# Patient Record
Sex: Female | Born: 1990 | Race: White | Hispanic: No | Marital: Single | Smoking: Never smoker
Health system: Southern US, Community
[De-identification: ages and names within clinical notes are randomized; demographics above are authoritative.]

## PROBLEM LIST (undated history)

## (undated) DIAGNOSIS — F988 Other specified behavioral and emotional disorders with onset usually occurring in childhood and adolescence: Secondary | ICD-10-CM

## (undated) DIAGNOSIS — F419 Anxiety disorder, unspecified: Secondary | ICD-10-CM

## (undated) DIAGNOSIS — R630 Anorexia: Secondary | ICD-10-CM

## (undated) DIAGNOSIS — F32A Depression, unspecified: Secondary | ICD-10-CM

## (undated) HISTORY — PX: WISDOM TOOTH EXTRACTION: SHX21

## (undated) HISTORY — DX: Depression, unspecified: F32.A

## (undated) HISTORY — PX: TONSILLECTOMY: SUR1361

## (undated) HISTORY — PX: ADENOIDECTOMY: SUR15

## (undated) HISTORY — DX: Anxiety disorder, unspecified: F41.9

---
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
## 2011-07-09 ENCOUNTER — Encounter: Payer: Self-pay | Admitting: *Deleted

## 2011-07-09 ENCOUNTER — Emergency Department (HOSPITAL_BASED_OUTPATIENT_CLINIC_OR_DEPARTMENT_OTHER)
Admission: EM | Admit: 2011-07-09 | Discharge: 2011-07-09 | Disposition: A | Discharge status: Home / Self Care | Payer: BC Managed Care – PPO | Attending: Emergency Medicine | Admitting: Emergency Medicine

## 2011-07-09 DIAGNOSIS — X19XXXA Contact with other heat and hot substances, initial encounter: Secondary | ICD-10-CM | POA: Insufficient documentation

## 2011-07-09 DIAGNOSIS — T2000XA Burn of unspecified degree of head, face, and neck, unspecified site, initial encounter: Secondary | ICD-10-CM

## 2011-07-09 DIAGNOSIS — T2012XA Burn of first degree of lip(s), initial encounter: Secondary | ICD-10-CM | POA: Insufficient documentation

## 2011-07-09 MED ORDER — SILVER SULFADIAZINE 1 % EX CREA
TOPICAL_CREAM | Freq: Once | CUTANEOUS | Status: AC
  Administered 2011-07-09: 1 via TOPICAL

## 2011-07-09 MED ORDER — SILVER SULFADIAZINE 1 % EX CREA
TOPICAL_CREAM | CUTANEOUS | Status: AC
  Filled 2011-07-09: qty 85

## 2011-07-09 NOTE — ED Notes (Signed)
Wound care reviewed.

## 2011-07-09 NOTE — ED Provider Notes (Signed)
Medical screening examination/treatment/procedure(s) were performed by non-physician practitioner and as supervising physician I was immediately available for consultation/collaboration.   Jasmine Awe, MD 07/09/11 2250
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
## 2011-07-09 NOTE — ED Notes (Signed)
Wound care reviewed with pt and parent
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
## 2011-07-09 NOTE — ED Notes (Addendum)
Pt went to get areas of her face waxed earlier today. Now presents with deep reddened area over right side of mouth.

## 2013-03-19 ENCOUNTER — Emergency Department (HOSPITAL_BASED_OUTPATIENT_CLINIC_OR_DEPARTMENT_OTHER): Payer: 59

## 2013-03-19 ENCOUNTER — Encounter (HOSPITAL_BASED_OUTPATIENT_CLINIC_OR_DEPARTMENT_OTHER): Payer: Self-pay | Admitting: Emergency Medicine

## 2013-03-19 ENCOUNTER — Emergency Department (HOSPITAL_BASED_OUTPATIENT_CLINIC_OR_DEPARTMENT_OTHER)
Admission: EM | Admit: 2013-03-19 | Discharge: 2013-03-19 | Disposition: A | Discharge status: Home / Self Care | Payer: 59 | Attending: Emergency Medicine | Admitting: Emergency Medicine

## 2013-03-19 DIAGNOSIS — Z79899 Other long term (current) drug therapy: Secondary | ICD-10-CM | POA: Insufficient documentation

## 2013-03-19 DIAGNOSIS — M7989 Other specified soft tissue disorders: Secondary | ICD-10-CM | POA: Insufficient documentation

## 2013-03-19 DIAGNOSIS — M79609 Pain in unspecified limb: Secondary | ICD-10-CM | POA: Insufficient documentation

## 2013-03-19 DIAGNOSIS — R071 Chest pain on breathing: Secondary | ICD-10-CM | POA: Insufficient documentation

## 2013-03-19 DIAGNOSIS — F988 Other specified behavioral and emotional disorders with onset usually occurring in childhood and adolescence: Secondary | ICD-10-CM | POA: Insufficient documentation

## 2013-03-19 DIAGNOSIS — F411 Generalized anxiety disorder: Secondary | ICD-10-CM | POA: Insufficient documentation

## 2013-03-19 DIAGNOSIS — J45909 Unspecified asthma, uncomplicated: Secondary | ICD-10-CM | POA: Insufficient documentation

## 2013-03-19 DIAGNOSIS — Z3202 Encounter for pregnancy test, result negative: Secondary | ICD-10-CM | POA: Insufficient documentation

## 2013-03-19 DIAGNOSIS — E876 Hypokalemia: Secondary | ICD-10-CM | POA: Insufficient documentation

## 2013-03-19 HISTORY — DX: Other specified behavioral and emotional disorders with onset usually occurring in childhood and adolescence: F98.8

## 2013-03-19 HISTORY — DX: Anorexia: R63.0

## 2013-03-19 LAB — BASIC METABOLIC PANEL
BUN: 11 mg/dL (ref 6–23)
Chloride: 101 mEq/L (ref 96–112)
GFR calc Af Amer: >90 mL/min (ref >90)
GFR calc non Af Amer: >90 mL/min (ref >90)
Potassium: 2.9 mEq/L — ABNORMAL LOW (ref 3.5–5.1)
Sodium: 139 mEq/L (ref 135–145)

## 2013-03-19 LAB — PREGNANCY, URINE: Preg Test, Ur: NEGATIVE

## 2013-03-19 LAB — CBC WITH DIFFERENTIAL/PLATELET
Basophils Absolute: 0.1 10*3/uL (ref 0.0–0.1)
Basophils Relative: 1 % (ref 0–1)
Eosinophils Absolute: 0.3 10*3/uL (ref 0.0–0.7)
MCH: 30.9 pg (ref 26.0–34.0)
MCHC: 35.8 g/dL (ref 30.0–36.0)
Neutro Abs: 2.2 10*3/uL (ref 1.7–7.7)
Neutrophils Relative %: 33 % — ABNORMAL LOW (ref 43–77)
Platelets: 336 10*3/uL (ref 150–400)
RDW: 13 % (ref 11.5–15.5)

## 2013-03-19 LAB — D-DIMER, QUANTITATIVE: D-Dimer, Quant: 0.28 ug/mL-FEU (ref 0.00–0.48)

## 2013-03-19 MED ORDER — POTASSIUM CHLORIDE CRYS ER 20 MEQ PO TBCR
40.0000 meq | EXTENDED_RELEASE_TABLET | Freq: Once | ORAL | Status: AC
  Administered 2013-03-19: 40 meq via ORAL
  Filled 2013-03-19: qty 2

## 2013-03-19 NOTE — ED Provider Notes (Signed)
History     CSN: [id number]  Arrival date & time 03/19/13  2012   First MD Initiated Contact with Patient 03/19/13 2022      Chief Complaint  Patient presents with  . Shortness of Breath  . Anxiety    (Consider location/radiation/quality/duration/timing/severity/associated sxs/prior treatment) HPI Comments: 22 y.o. Female with PMHx of asthma, anorexia, and ADD presents today with mother with multiple complaints that have all been occuring for the past 6 months to a year. Pt states there was nothing different that prompted today's ER visit. Though she has a PCP, she has not sought any medical treatment while experiencing these sx which includes shortness of breath that has been occuring "all day every day for the last 6 months." Pt states she feels "contricted" at rest, not on exertion. Admits some pleuritic chest pain and occasional chest tenderness to palpation that has also been going on for about 6 months. Pt states she can barely walk for 5 minutes when her ankles swell and her legs hurt which has been happening for many months as well. Pt does use estrogen-based birth control. Is not a smoker. No hx of clots. No recent travel.   Patient is a 22 y.o. female presenting with shortness of breath and anxiety. The history is provided by the patient.  Shortness of Breath Severity:  Mild Onset quality:  Gradual Duration: "all day every day for 6 months" Timing:  Constant Progression:  Unchanged Chronicity:  Recurrent Context comment:  More with rest than exertion Relieved by: activity. Associated symptoms: chest pain   Associated symptoms: no diaphoresis, no fever, no headaches, no neck pain, no rash and no vomiting   Anxiety Associated symptoms include chest pain. Pertinent negatives include no diaphoresis, fever, headaches, nausea, neck pain, numbness, rash, vomiting or weakness.    Past Medical History  Diagnosis Date  . Asthma   . Anorexia   . ADD (attention deficit disorder)      Past Surgical History  Procedure Laterality Date  . Tonsillectomy    . Adenoidectomy    . Wisdom tooth extraction      No family history on file.  History  Substance Use Topics  . Smoking status: Never Smoker   . Smokeless tobacco: Not on file  . Alcohol Use: No    OB History   Grav Para Term Preterm Abortions TAB SAB Ect Mult Living                  Review of Systems  Constitutional: Negative for fever and diaphoresis.  HENT: Negative for neck pain and neck stiffness.   Eyes: Negative for visual disturbance.  Respiratory: Positive for shortness of breath. Negative for apnea and chest tightness.   Cardiovascular: Positive for chest pain and leg swelling. Negative for palpitations.  Gastrointestinal: Negative for nausea, vomiting, diarrhea and constipation.  Genitourinary: Negative for dysuria, hematuria and pelvic pain.  Musculoskeletal: Negative for gait problem.       Calf pain  Skin: Negative for rash.  Neurological: Negative for dizziness, weakness, light-headedness, numbness and headaches.    Allergies  Review of patient's allergies indicates no known allergies.  Home Medications   Current Outpatient Rx  Name  Route  Sig  Dispense  Refill  . ampicillin (PRINCIPEN) 250 MG capsule   Oral   Take 250 mg by mouth daily.           . norethindrone-ethinyl estradiol (JUNEL FE,GILDESS FE,LOESTRIN FE) 1-20 MG-MCG tablet   Oral  Take 1 tablet by mouth daily.         . valACYclovir (VALTREX) 500 MG tablet   Oral   Take 500 mg by mouth daily.         Marland Kitchen BIOTIN 5000 PO   Oral   Take 1 tablet by mouth daily.           Marland Kitchen desogestrel-ethinyl estradiol (VIORELE) 0.15-0.02/0.01 MG (21/5) tablet   Oral   Take 1 tablet by mouth daily.           . Multiple Vitamin (MULTIVITAMIN) tablet   Oral   Take 1 tablet by mouth daily.           Marland Kitchen tetrahydrozoline-zinc (VISINE-AC) 0.05-0.25 % ophthalmic solution   Both Eyes   Place 2 drops into both eyes  daily.             BP 141/96  Pulse 94  Temp(Src) 98.5 F (36.9 C) (Oral)  Resp 20  Ht 5\' 6"  (1.676 m)  Wt 135 lb (61.236 kg)  BMI 21.8 kg/m2  SpO2 100%  Physical Exam  Nursing note and vitals reviewed. Constitutional: She is oriented to person, place, and time. She appears well-developed and well-nourished. No distress.  HENT:  Head: Normocephalic and atraumatic.  Eyes: Conjunctivae and EOM are normal.  Neck: Normal range of motion. Neck supple.  No meningeal signs  Cardiovascular: Regular rhythm, normal heart sounds and intact distal pulses.  Exam reveals no gallop and no friction rub.   No murmur heard. Tachycardic to one-teens  Pulmonary/Chest: Effort normal and breath sounds normal. No respiratory distress. She has no wheezes. She has no rales. She exhibits no tenderness.  Abdominal: Soft. Bowel sounds are normal. She exhibits no distension. There is no tenderness. There is no rebound and no guarding.  Musculoskeletal: Normal range of motion. She exhibits no edema and no tenderness.  FROM to upper and lower extremities No edema to lower extremities No calf tenderness  Neurological: She is alert and oriented to person, place, and time. No cranial nerve deficit.  Speech is clear and goal oriented, follows commands Sensation normal to light touch Moves extremities without ataxia, coordination intact Normal gait and balance Normal strength in upper and lower extremities bilaterally including dorsiflexion and plantar flexion, strong and equal grip strength   Skin: Skin is warm and dry. She is not diaphoretic. No erythema.  Psychiatric:  anxious    Date: 03/19/2013  Rate: 104  Rhythm: sinus tachycardia  QRS Axis: normal  Intervals: normal  ST/T Wave abnormalities: normal  Conduction Disutrbances:none  Narrative Interpretation: otherwise normal EKG  Old EKG Reviewed: none available   ED Course  Procedures (including critical care time)  Labs Reviewed  CBC WITH  DIFFERENTIAL - Abnormal; Notable for the following:    Neutrophils Relative % 33 (*)    Lymphocytes Relative 54 (*)    All other components within normal limits  BASIC METABOLIC PANEL - Abnormal; Notable for the following:    Potassium 2.9 (*)    Calcium 10.6 (*)    All other components within normal limits  D-DIMER, QUANTITATIVE  PREGNANCY, URINE   Dg Chest 2 View  03/19/2013   *RADIOLOGY REPORT*  Clinical Data: Shortness of breath, chest pain  CHEST - 2 VIEW  Comparison:  Prior exam 11/13/2000 is archived and unavailable for direct comparison.  Findings:  The heart size and mediastinal contours are within normal limits.  Both lungs are clear.  The visualized skeletal structures are  unremarkable.  IMPRESSION: No active cardiopulmonary disease.   Original Report Authenticated By: Christiana Pellant, M.D.     1. Hypokalemia    Filed Vitals:   03/19/13 2017 03/19/13 2136  BP: 141/96 110/69  Pulse: 94 88  Temp: 98.5 F (36.9 C) 98.2 F (36.8 C)  TempSrc: Oral Oral  Resp: 20 20  Height: 5\' 6"  (1.676 m)   Weight: 135 lb (61.236 kg)   SpO2: 100% 100%      MDM  Pt is well-appearing though seems anxious. Had difficulty expressing her symptoms and giggled uncomfortably at my questions. Do not suspect PE, but will get d-dimer as there are some risk factors to consider. Will get CXR, EKG, basic labs and re-evaluate.   EKG shows sinus tach. Will recheck vitals. Labs show hypokalemia. Will give PO K-Cl and discuss findings with pt. Other labs unconcerning.   At re-eval, pt HR was in 80s. At this time there does not appear to be any evidence of an acute emergency medical condition and the patient appears stable for discharge with appropriate outpatient follow up.Diagnosis was discussed with patient who verbalizes understanding and is agreeable to discharge. Pt case discussed with Dr. Manus Gunning who agrees with plan.      Glade Nurse, PA-C 03/19/13 2224

## 2013-03-19 NOTE — ED Provider Notes (Signed)
Medical screening examination/treatment/procedure(s) were performed by non-physician practitioner and as supervising physician I was immediately available for consultation/collaboration.   Glynn Octave, MD 03/19/13 [phone]

## 2013-03-19 NOTE — ED Notes (Signed)
Pt states she was on Adderal several month ago which caused her anxiety so she quit taking

## 2013-03-19 NOTE — ED Notes (Signed)
PA at bedside.

## 2013-03-19 NOTE — ED Notes (Signed)
Pt states she feels shob x 1 year.

## 2014-09-16 ENCOUNTER — Other Ambulatory Visit: Payer: Self-pay | Admitting: Physician Assistant

## 2014-09-23 ENCOUNTER — Other Ambulatory Visit: Payer: Self-pay | Admitting: Physician Assistant

## 2014-10-26 ENCOUNTER — Other Ambulatory Visit: Payer: Self-pay | Admitting: Physician Assistant

## 2014-11-20 IMAGING — CR DG CHEST 2V
2 series · 2 of 2 positions shown · non-contrast
Comparison: Prior exam 11/13/2000 is archived and unavailable for
direct comparison.

CLINICAL DATA: Shortness of breath, chest pain

CHEST - 2 VIEW

[w chest pa]
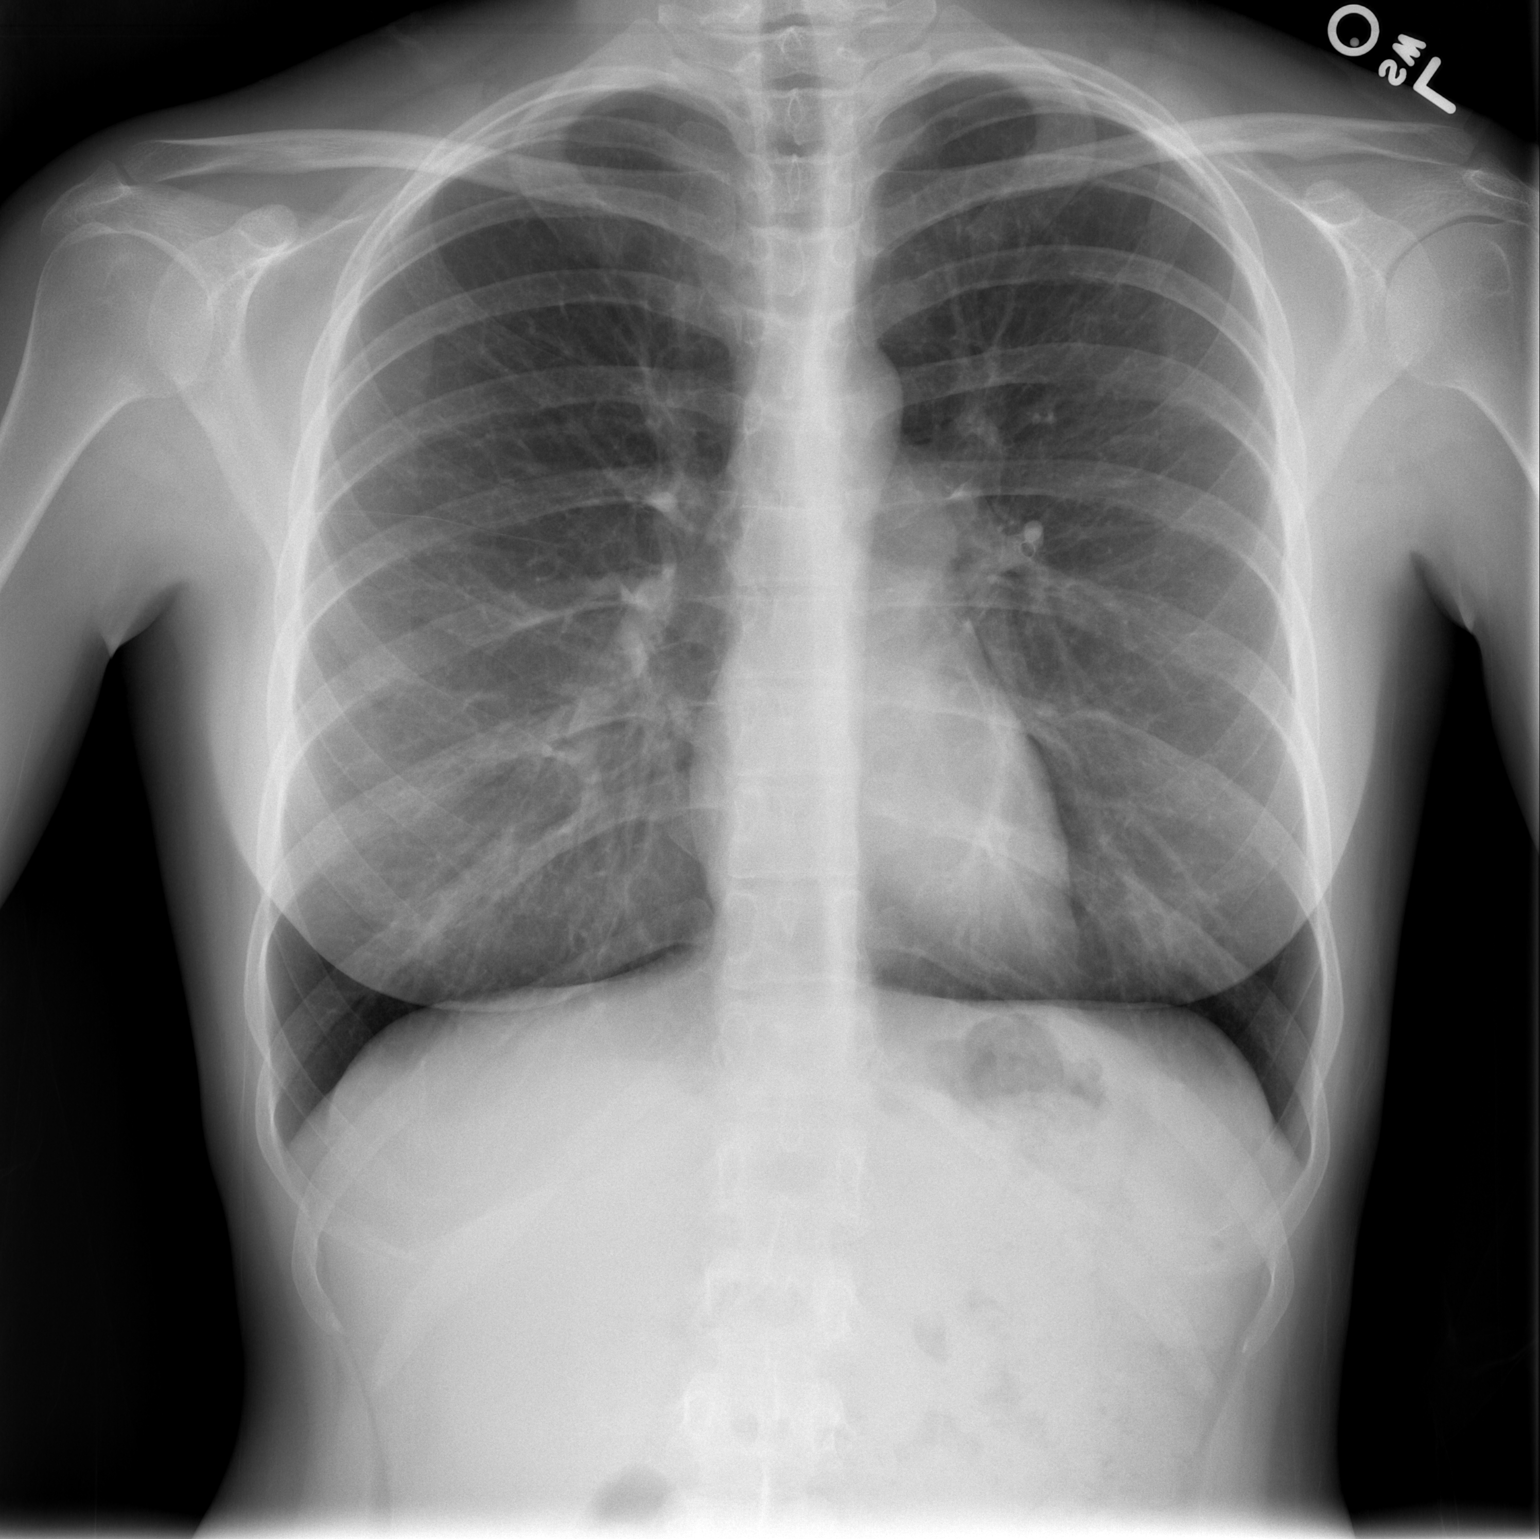

[w chest lat]
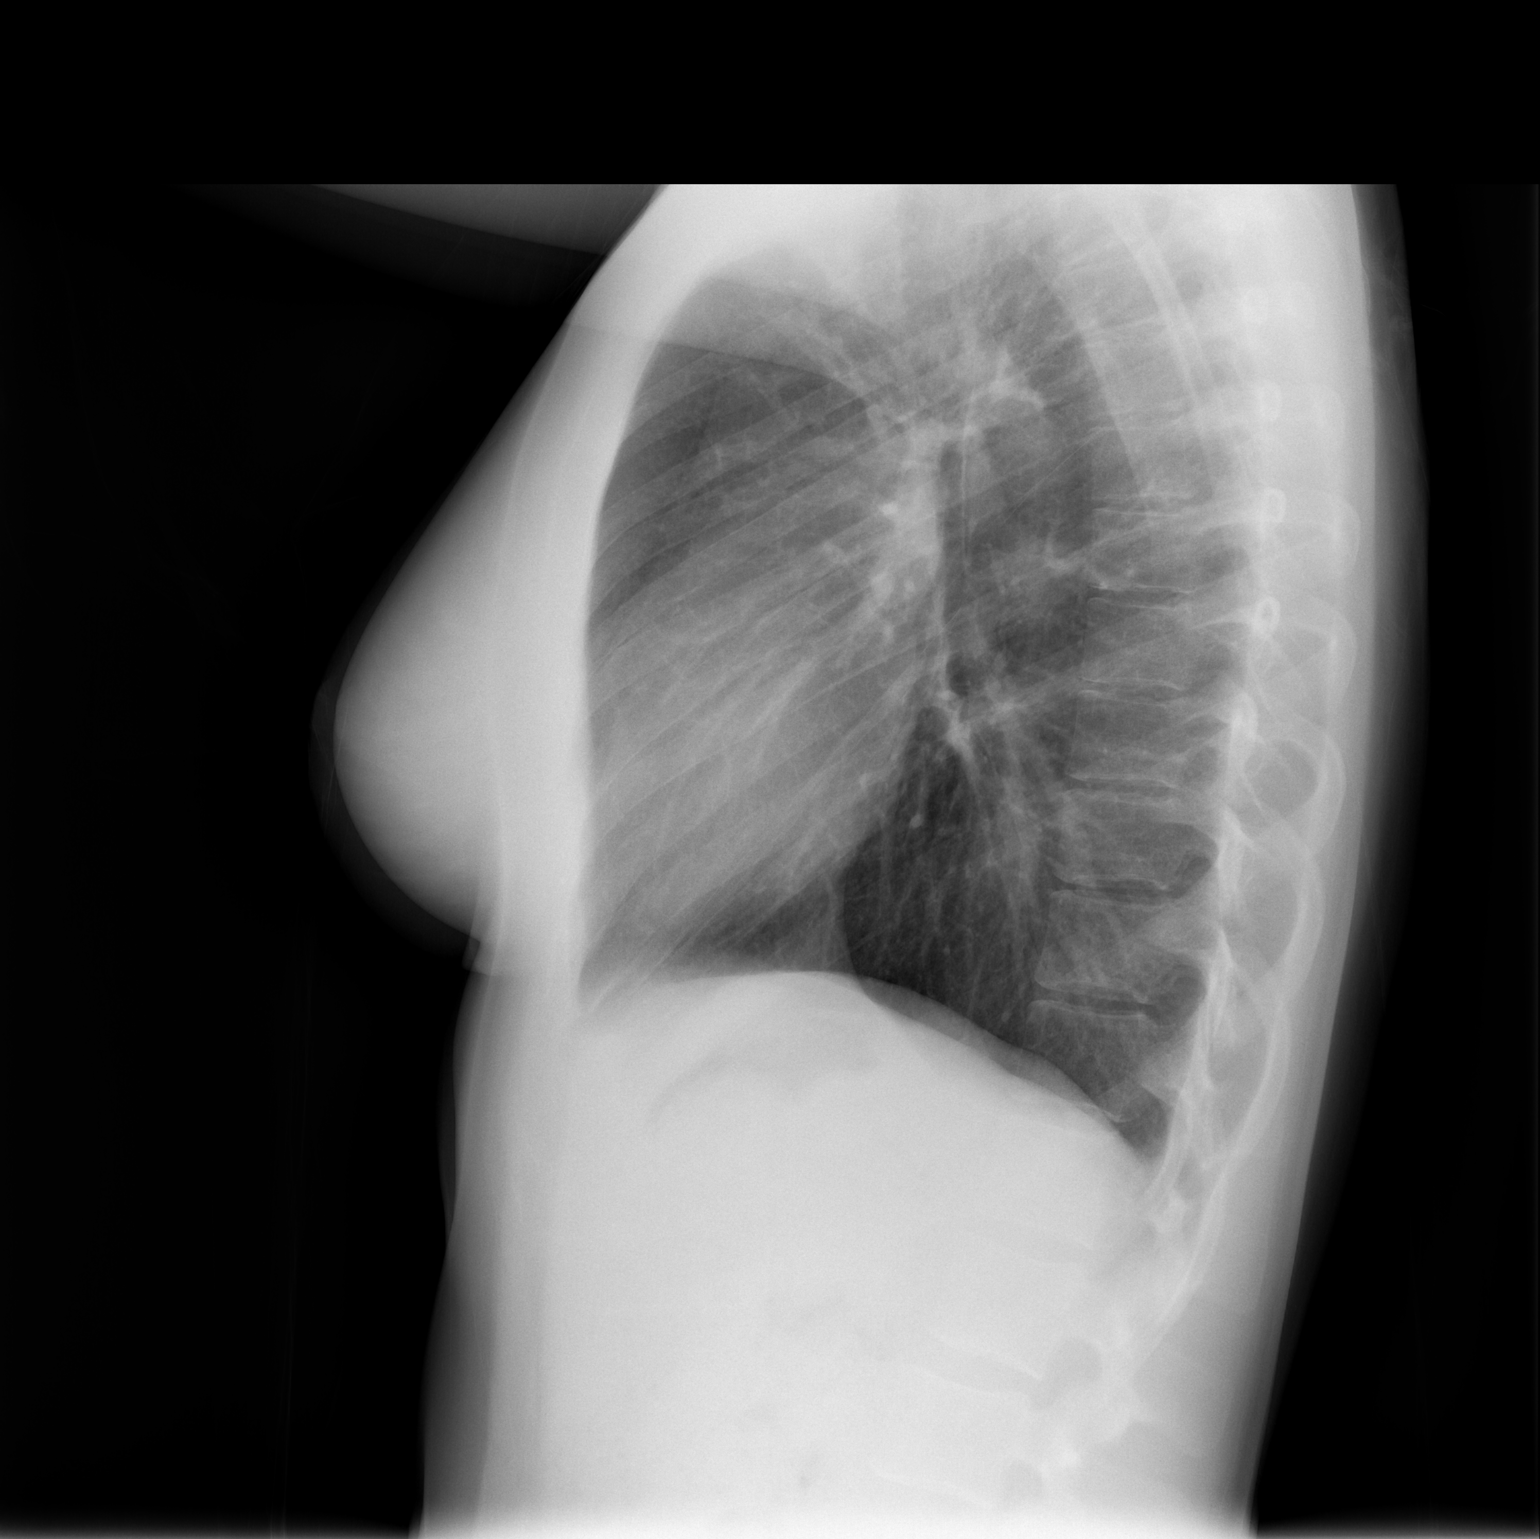

[2 of 2 positions shown; findings below may reference images not displayed]

FINDINGS: The heart size and mediastinal contours are within
normal limits.  Both lungs are clear.  The visualized skeletal
structures are unremarkable.
IMPRESSION: No active cardiopulmonary disease.

## 2014-12-07 ENCOUNTER — Other Ambulatory Visit: Payer: Self-pay | Admitting: Physician Assistant

## 2015-07-20 ENCOUNTER — Other Ambulatory Visit: Payer: Self-pay | Admitting: Physician Assistant

## 2020-12-27 ENCOUNTER — Other Ambulatory Visit: Payer: Self-pay | Admitting: Neurology

## 2020-12-28 ENCOUNTER — Ambulatory Visit (INDEPENDENT_AMBULATORY_CARE_PROVIDER_SITE_OTHER): Payer: BC Managed Care – PPO | Admitting: Neurology

## 2020-12-28 ENCOUNTER — Encounter: Payer: Self-pay | Admitting: Neurology

## 2020-12-28 VITALS — BP 99/75 | HR 93 | Ht 67.25 in | Wt 132.0 lb

## 2020-12-28 DIAGNOSIS — M2619 Other specified anomalies of jaw-cranial base relationship: Secondary | ICD-10-CM | POA: Diagnosis not present

## 2020-12-28 DIAGNOSIS — R065 Mouth breathing: Secondary | ICD-10-CM | POA: Diagnosis not present

## 2020-12-28 DIAGNOSIS — J342 Deviated nasal septum: Secondary | ICD-10-CM

## 2020-12-28 DIAGNOSIS — G4719 Other hypersomnia: Secondary | ICD-10-CM | POA: Diagnosis not present

## 2020-12-28 DIAGNOSIS — R6889 Other general symptoms and signs: Secondary | ICD-10-CM

## 2020-12-28 NOTE — Progress Notes (Signed)
SLEEP MEDICINE CLINIC    Provider:  Larey Seat, MD  Primary Care Physician:   Christine Major, Md [address] [place],  [place] [address]      Referring Provider: Lenord Howard, DDS , Orthodontist,  [facility] and private practice on [place] and in [place]. .            Chief Complaint according to patient   Patient presents with:    . New Patient (Initial Visit)     Presents today with upcoming jaw surgery that is planned. They are wanting her to rule out apnea. She has never had a SS. She is unsure if she snores. Avg 7/8 hrs of broken sleep. Wakes up still tired.      HISTORY OF PRESENT ILLNESS:  Christine Howard is a 30 y.o. White or Caucasian female patient is seen upon Consultation requested by Dr. Lenord Howard, [place] on 12/28/2020 ,  Chief concern according to patient : Christine Howard, reports that she is planning to undergo a jaw surgery with Dr. Alroy Howard and one concern was that she may have due to her specific jaw anatomy higher risk of obstructive sleep apnea.  She does not really show or feel that she is fighting for breath at night but she does wake up frequently she also has a lot of green pressure of vivid lucid dreams and some of her arousals may be related to that.  She had started on Lexapro which is usually of REM sleep suppressant medication but in her case it increase the frequency of dreams and the intensity to where she sometimes felt them are very real.  She also has excessive daytime sleepiness she has great difficulties for example staying awake and sitting and reading or being in a quiet place not physically active and not mentally stimulated.  This can even happen while sitting and active in a public place she has no trouble taking naps if she wants to.  Her degree of fatigue is also very high endorsing 52 points out of 63.  She does have a history of recurrent Howard depressive episodes and anxiety and she was diagnosed with  ADHD.    Christine Howard  has a past medical history of ADHD- she thinks this is treating her hypersomnia  More than ADHD- (attention deficit disorder), Anorexia, Anxiety and depression, and Asthma..    Sleep relevant medical history:vivid dreams, no parasomnia, sleeping alone.  She had a Tonsillectomy in 4 th grade.  Nasal deviation. Multiple teeth extracted, crowded jaw, small oral opening. expanders and braces were worn.    Family medical /sleep history: No other family member diagnosed with OSA,  Father is a loud snorer, insomnia, sleep walkers.    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 . Family status is single. The patient currently works office day time hours.  Pets are not present. Tobacco use- none .  ETOH use ; rarely,  Caffeine intake in form of Coffee( 1-2 cups) Soda( /) Tea ( /) or energy drinks. Regular exercise in form of : no work out,  Used to play volley ball .   Hobbies: none     Sleep habits are as follows: The patient's dinner time is between 5.30 PM. The patient goes to bed at 9 PM and  Asleep by 9.30 continues to sleep for intervals of 2 hours, wakes 3-4  times  The preferred sleep position is sideways, with the support of 1 pillow.  Dreams are reportedly frequent and vivid- lucid at times.   7.10 AM is the usual rise time. The patient wakes up with an alarm at 6.40 AM , snoozes the alarm .  She reports not feeling refreshed or restored in AM, with symptoms such as dry mouth and residual fatigue.  Without adderall she would be asleep many times a day- on medication she is still prone to naps - lasting from 15-60 minutes and are not more refreshing than nocturnal sleep.    Review of Systems: Out of a complete 14 system review, the patient complains of only the following symptoms, and all other reviewed systems are negative.:  Fatigue, sleepiness , snoring, fragmented sleep, vivid dreams.  Small oral opening, jaw is crowded.    How likely are you to doze in the following situations: 0 = not likely, 1 = slight chance, 2 = moderate chance, 3 = high chance   Sitting and Reading? Watching Television? Sitting inactive in a public place (theater or meeting)? As a passenger in a car for an hour without a break? Lying down in the afternoon when circumstances permit? Sitting and talking to someone? Sitting quietly after lunch without alcohol? In a car, while stopped for a few minutes in traffic?   Total = 18/ 24 points without adderall, and 14 / 24 with medication   FSS endorsed at 52/ 63 points.   Social History   Socioeconomic History  . Marital status: Single    Spouse name: Not on file  . Number of children: Not on file  . Years of education: Not on file  . Highest education level: Not on file  Occupational History  . Not on file  Tobacco Use  . Smoking status: Never Smoker  . Smokeless tobacco: Never Used  Substance and Sexual Activity  . Alcohol use: No  . Drug use: No  . Sexual activity: Yes    Birth control/protection: Pill  Other Topics Concern  . Not on file  Social History Narrative  . Not on file   Social Determinants of Health   Financial Resource Strain: Not on file  Food Insecurity: Not on file  Transportation Needs: Not on file  Physical Activity: Not on file  Stress: Not on file  Social Connections: Not on file    Family History  Problem Relation Age of Onset  . Thyroid cancer Maternal Grandfather   . Lymphoma Paternal Grandmother     Past Medical History:  Diagnosis Date  . ADD (attention deficit disorder)   . Anorexia   . Anxiety and depression   . Asthma     Past Surgical History:  Procedure Laterality Date  . ADENOIDECTOMY    . TONSILLECTOMY    . WISDOM TOOTH EXTRACTION       Current Outpatient Medications on File Prior to Visit  Medication Sig Dispense Refill  . ALPRAZolam (XANAX) 0.5 MG tablet TAKE 1-2 TABLETS BY  MOUTH EVERY DAY AS NEEDED    . amitriptyline (ELAVIL) 100 MG tablet Take 100 mg by mouth daily.    Marland Kitchen amphetamine-dextroamphetamine (ADDERALL XR) 20 MG 24 hr capsule Take by mouth.    . escitalopram (LEXAPRO) 20 MG tablet Take 1 tablet by mouth daily.    . Multiple Vitamin (MULTIVITAMIN) tablet Take 1 tablet by mouth daily.    . Norethindrone-Ethinyl Estradiol-Fe Biphas (LO LOESTRIN FE) 1 MG-10 MCG / 10 MCG tablet Take  1 tablet by mouth daily.    . valACYclovir (VALTREX) 500 MG tablet Take 500 mg by mouth daily.     No current facility-administered medications on file prior to visit.   Physical exam:  Today's Vitals   12/28/20 0845  BP: 99/75  Pulse: 93  Weight: 132 lb (59.9 kg)  Height: 5' 7.25" (1.708 m)   Body mass index is 20.52 kg/m.   Wt Readings from Last 3 Encounters:  12/28/20 132 lb (59.9 kg)  03/19/13 135 lb (61.2 kg)  07/09/11 125 lb (56.7 kg)     Ht Readings from Last 3 Encounters:  12/28/20 5' 7.25" (1.708 m)  03/19/13 '5\' 6"'  (1.676 m)  07/09/11 '5\' 7"'  (1.702 m)      General: The patient is awake, alert and appears not in acute distress. The patient is well groomed. Head: Normocephalic, atraumatic. Neck is supple. Mallampati ,  neck circumference:13.5  inches . Nasal airflow restricted, septal deviation to the right. No history of trauma.    Retrognathia is seen.  Dental status: crowded but aligned. Cardiovascular:  Regular rate and cardiac rhythm by pulse,  without distended neck veins. Respiratory: Lungs are clear to auscultation.  Skin:  Without evidence of ankle edema, or rash. Trunk: The patient's posture is erect.   Neurologic exam : The patient is awake and alert, oriented to place and time.   Memory subjective described as intact.  Attention span & concentration ability appears normal.  Speech is fluent,  without  dysarthria, dysphonia or aphasia.  Mood and affect are a little hyper- but appropriate.   Cranial nerves: no loss of smell or taste  reported  Pupils are equal and briskly reactive to light. Funduscopic exam deferred.  Extraocular movements in vertical and horizontal planes were intact and without nystagmus. No Diplopia. Visual fields by finger perimetry are intact. Hearing was intact to soft voice and finger rubbing.    Facial sensation intact to fine touch.  Facial motor strength is symmetric and tongue and uvula move midline.  Neck ROM : rotation, tilt and flexion extension were normal for age and shoulder shrug was symmetrical.    Motor exam:  Symmetric bulk, tone and ROM.   Normal tone without cog-wheeling, symmetric grip strength .   Sensory:  Fine touch and vibration were  normal.  Proprioception tested in the upper extremities was normal.   Coordination: Rapid alternating movements in the fingers/hands were of normal speed.  The Finger-to-nose maneuver was intact without evidence of ataxia, dysmetria or tremor.   Gait and station: Patient could rise unassisted from a seated position, walked without assistive device.  Stance is of normal width/ base and the patient turned with 3 steps.  Toe and heel walk were deferred.  Deep tendon reflexes: in the  upper and lower extremities are symmetric and intact.  Babinski response was deferred .      After spending a total time of 45 minutes face to face and additional time for physical and neurologic examination, review of laboratory studies,  personal review of imaging studies, reports and results of other testing and review of referral information / records as far as provided in visit, I have established the following assessments:  1) I have the pleasure of meeting with his new patient today, she averages probably 7 to 8 hours of sleep at night but her sleep is very fragmented she at least wakes up 3 4 sometimes 5 times she is not always sure what wakes her it  seems not to be related to nocturia or headaches but rather to sometimes vivid dreams.  It is her jaw anatomy  which led to today's referral as she is planning to undergo surgery with Dr. Alroy Howard.  Her retrognathia and small oral opening at risk factors for obstructive sleep apnea in spite of a slender neck, a Mallampati grade 1, and a low BMI.     2)She is not sure if she snores as she sleeps alone- but she does live in her parental home.    3) EDS-She remains excessively daytime sleepy and has done so for many years, the sleepiness is somewhat ameliorated by Adderall.  She remains however in the 2 digits of the Epworth score.     My Plan is to proceed with:  1) I would like to obtain a HST -if no apnea is found , we can look at a PSG with MSLT in the future. It would require medication weaning .  2) Narcolepsy panel/ HLA will be drawn.   I would like to thank  Christine Major, Md [address] [place],  [place] [address] for allowing me to meet with and to take care of this pleasant patient.   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 She reports lucid dreams, dreaming in installments, realistic dreams, no sleep paralysis.  She was not sure about cataplectic attack. I would like to obtain a HST -if no apnea is found , we can look at a PSG with MSLT in the future. It would require medication weaning .    I plan to follow up either personally or through our NP within 3 month.   CC: I will share my notes with PCP and Dr Christine Howard.   Electronically signed by: Larey Seat, MD 12/28/2020 9:18 AM  [facility] Board certified by The [employer] of Sleep Medicine and Diplomate of the [employer] of Sleep Medicine. Board certified In Neurology through the [place], Fellow of the [employer] of Neurology. Medical Director of [facility].

## 2020-12-28 NOTE — Patient Instructions (Signed)

## 2021-01-05 LAB — NARCOLEPSY EVALUATION
DQA1*01:02: NEGATIVE
DQB1*06:02: NEGATIVE

## 2021-01-05 NOTE — Progress Notes (Signed)
Negative HLA test- I think that MSLT would be of low yield in this case, but we can send for a hypocretin level.

## 2021-01-06 ENCOUNTER — Telehealth: Payer: Self-pay | Admitting: Neurology

## 2021-01-06 NOTE — Telephone Encounter (Signed)
Called the patient to inform of the lab results. There was no answer. LVM informing of the result and instructing the patient to call back if she has questions.  **  If patient calls back please advise that the lab Dr. Vickey Huger  ordered to test for the narcolepsy gene came back negative.  Dr. Vickey Huger would like for her to still have the home sleep test in the sleep lab will be in contact to get her scheduled.

## 2021-01-06 NOTE — Telephone Encounter (Signed)
-----  Message from Larey Seat, MD sent at 01/05/2021  1:25 PM EDT ----- Negative HLA test- I think that MSLT would be of low yield in this case, but we can send for a hypocretin level.

## 2021-01-19 ENCOUNTER — Ambulatory Visit (INDEPENDENT_AMBULATORY_CARE_PROVIDER_SITE_OTHER): Payer: BC Managed Care – PPO | Admitting: Neurology

## 2021-01-19 DIAGNOSIS — M2619 Other specified anomalies of jaw-cranial base relationship: Secondary | ICD-10-CM

## 2021-01-19 DIAGNOSIS — G4710 Hypersomnia, unspecified: Secondary | ICD-10-CM

## 2021-01-19 DIAGNOSIS — G4719 Other hypersomnia: Secondary | ICD-10-CM

## 2021-01-19 DIAGNOSIS — J342 Deviated nasal septum: Secondary | ICD-10-CM

## 2021-01-19 DIAGNOSIS — R065 Mouth breathing: Secondary | ICD-10-CM

## 2021-01-19 DIAGNOSIS — R6889 Other general symptoms and signs: Secondary | ICD-10-CM

## 2021-01-20 NOTE — Progress Notes (Signed)
   [facility]  HOME SLEEP TEST ( HST by Watch PAT)  STUDY DATE: 01-20-21  DOB: 04-Jan-1991  MRN: [id number]  ORDERING CLINICIAN: Melvyn Novas, MD   REFERRING CLINICIAN: Dr. Sissy Hoff, DDS  CLINICAL INFORMATION/HISTORY: Christine Howard, reports that she is planning to undergo a jaw surgery with Dr. Clovis Riley and one concern was that of OSA- she may have due to her specific jaw anatomy a higher risk of obstructive sleep apnea.  She does not really show or feel that she is fighting for breath at night but she does wake up frequently. she also has a lot of dream-pressure, reports vivid and lucid dreams and some of her arousals may be related to that.  She had started on Lexapro which is usually of REM sleep suppressant medication but in her case it increased the frequency of dreams and the intensity to where she sometimes felt them to be very real.   She also has excessive daytime sleepiness- she has great difficulties staying awake while sitting and reading or being in a quiet place, whenever not physically active and not mentally stimulated. This can even happen while sitting and active in a public place she has no trouble taking naps if she wants to. Epworth: 18 points.   Her degree of fatigue is also very high endorsing 52 points out of 63.  She does have a history of recurrent major depressive episodes and anxiety and she was diagnosed with ADHD.  Christine Howard  has a past medical history of ADHD- she thinks adderall is treating her hypersomnia more than ADHD- (attention deficit disorder), Anorexia, Anxiety and depression, and Asthma.  Epworth sleepiness score: 18/24. BMI: 20.8 kg/m Neck Circumference: 13.5 "  FINDINGS:  Total Record Time (hours, min): 8 h 0 min Total Sleep Time (hours, min):  6 h 21 min  Percent REM (%):    22.96 %   Calculated pAHI (per hour): 2.5       REM pAHI: 4.8  NREM pAHI: 2.0  Supine AHI: N/A   Oxygen Saturation (%) Mean: 96  Minimum  oxygen saturation (%):         89  O2 Saturation Range (%): 89-99  O2Saturation (minutes) <=88%: 0 min  Pulse Mean (bpm):    94  Pulse Range (67-116)   IMPRESSION: There was no evidence of OSA (obstructive sleep apnea) , even during REM sleep did this HST indicate an AHI under 5/h. Primary snoring was not found, RDI was 3.2 /h.  Unfortunately, we could not retrieve positional data.    RECOMMENDATION: Evaluate for narcolepsy - this degree of hypersomnia remains unexplained by the outcome of the HST.    INTERPRETING PHYSICIAN:  Melvyn Novas, MD  [facility] Sleep Board certified by The [employer] and Diplomate of the [employer] of [employer]. Board certified In Neurology through the ABPN, Fellow of the [employer] of [employer]. Medical Director of [employer].

## 2021-02-06 NOTE — Progress Notes (Signed)
REFERRING CLINICIAN: Dr. Sissy Hoff, DDS. Epworth sleepiness score: 18/24. BMI: 20.8 kg/m Neck Circumference: 13.5 "  IMPRESSION: There was no evidence of OSA (obstructive sleep apnea) , even during REM sleep did this HST indicate an AHI under 5/h. Evidence of disruptive primary snoring was not found, as her RDI was 3.2 /h.  Unfortunately, we could not retrieve positional data.    RECOMMENDATION: Evaluate for narcolepsy - this degree of hypersomnia remains unexplained by the outcome of the HST.   Please send aCC to DR Sissy Hoff, DDS - orthodontist/ [facility].

## 2021-02-08 ENCOUNTER — Telehealth: Payer: Self-pay | Admitting: Neurology

## 2021-02-08 NOTE — Telephone Encounter (Signed)
-----   Message from Melvyn Novas, MD sent at 02/06/2021  6:13 PM EDT ----- REFERRING CLINICIAN: Dr. Sissy Hoff, DDS. Epworth sleepiness score: 18/24. BMI: 20.8 kg/m Neck Circumference: 13.5 "  IMPRESSION: There was no evidence of OSA (obstructive sleep apnea) , even during REM sleep did this HST indicate an AHI under 5/h. Evidence of disruptive primary snoring was not found, as her RDI was 3.2 /h.  Unfortunately, we could not retrieve positional data.    RECOMMENDATION: Evaluate for narcolepsy - this degree of hypersomnia remains unexplained by the outcome of the HST.   Please send aCC to DR Sissy Hoff, DDS - orthodontist/ [facility].

## 2021-02-08 NOTE — Telephone Encounter (Signed)
Called the pt. was able to review the sleep study results with the patient.  Advised the patient that overall there was no indication of apnea that was of concern.  Oxygen and heart rate levels did fine.  Informed the patient if she does want to pursue the work up for narcolepsy it would mean having to wean off of several of her medications.  At this time patient was only having a sleep study done to rule out apnea so that she can have a procedure.  Patient states that she will continue doing what she is doing for now and if she decides to pursue MSLT in the future she will let us know.  Patient verbalized understanding of the results and had no further questions at this time.
